# Patient Record
Sex: Female | Born: 1945 | Race: White | Hispanic: No | Marital: Married | Smoking: Never smoker
Health system: Southern US, Community
[De-identification: ages and names within clinical notes are randomized; demographics above are authoritative.]

## PROBLEM LIST (undated history)

## (undated) DIAGNOSIS — E039 Hypothyroidism, unspecified: Secondary | ICD-10-CM

## (undated) DIAGNOSIS — E785 Hyperlipidemia, unspecified: Secondary | ICD-10-CM

## (undated) DIAGNOSIS — G379 Demyelinating disease of central nervous system, unspecified: Secondary | ICD-10-CM

## (undated) DIAGNOSIS — F419 Anxiety disorder, unspecified: Secondary | ICD-10-CM

## (undated) DIAGNOSIS — M810 Age-related osteoporosis without current pathological fracture: Secondary | ICD-10-CM

## (undated) HISTORY — DX: Anxiety disorder, unspecified: F41.9

## (undated) HISTORY — DX: Demyelinating disease of central nervous system, unspecified: G37.9

## (undated) HISTORY — DX: Age-related osteoporosis without current pathological fracture: M81.0

## (undated) HISTORY — DX: Hypothyroidism, unspecified: E03.9

## (undated) HISTORY — DX: Hyperlipidemia, unspecified: E78.5

## (undated) HISTORY — PX: BREAST BIOPSY: SHX20

## (undated) HISTORY — PX: COLONOSCOPY: SHX174

---

## 2019-12-23 ENCOUNTER — Encounter: Payer: Self-pay | Admitting: Neurology

## 2019-12-23 ENCOUNTER — Other Ambulatory Visit: Payer: Self-pay | Admitting: Neurology

## 2019-12-23 NOTE — Progress Notes (Addendum)
[facility]    Provider:  Dr Lucia Gaskins Requesting Provider: Jerene Pitch, MD Primary Care Provider:  Jerene Pitch, MD  CC:  Memory loss  HPI:  Paulena Servais is a 74 y.o. female here as requested by Jerene Pitch, MD for dementia.  I reviewed Dr. 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  She is currently on amantadine 2 times daily, also on memantine but I do not see donepezil.  Last labs taken November 06, 2019 included a BUN of 16 and a creatinine of 1.02, LFTs normal.  Did state that her memory deficits were getting worse, her husband Maryclare Bean having to do all the cooking, he does not know what meds she is taking but "she has 20 bottles on her bedside table", she has 3 different daily weekly pill dispenser he boxes, patient offers no complaint.  They scheduled a CT of the brain without contrast and she was referred to Dr. Leanna Battles for eval who she already sees.  Other labs found included a B12 which is greater than 1500, BMP from June 30th 2021 showed a BUN of 16 and a creatinine of 1.02.  40 years ago she was told that she had Multiple Sclerosis, sister was bed ridden and passed away being bed ridden. She has a history of dementia for many years. A nurse told her she was starting to look like she had MS. They saw a neurologist in the past, 1998, unclear who in [place]. She was diagnosed with MS possibly. She was treated with parkinson's disease. She is on amantadine unclear why. A year ago they saw another neurologist and he said she did not have MS or parkinson's disease. They are worried about Parkinson's disease  and/or MS. Memory loss at least 30 years and slowly progressed and really got worse a year ago, she also can't hear well. She has 10 falls in the last 2 weeks, she was placed on Remeron and she had psychosis and it was stopped and they went to the ED and now improved. The tremors have resolved with stopping the remeron. Brothre with dementia. Started with short term memory loss in her late 78s. Husband and son provide all the informtation.  Patient is here with her family, son and husband.   Reviewed notes, labs and imaging from outside physicians, which showed: see above  Personally reviewed CT images which showed atrophy and chronic microvascular ischemic changes no acute findings.  Review of Systems: Patient complains of symptoms per HPI as well as the following symptoms: memory loss, confusion. Pertinent negatives and positives per HPI. All others negative.   Social History   Socioeconomic History  . Marital status: Married    Spouse name: Not on file  . Number of children: Not on file  . Years of education: Not on file  . Highest education level: Not on file  Occupational History  . Not on file  Tobacco Use  . Smoking status: Never Smoker  . Smokeless tobacco: Never Used  Substance and Sexual Activity  . Alcohol use: Never  . Drug use: Never  . Sexual activity: Not on file  Other Topics Concern  . Not on file  Social History Narrative  . Not on file   Social Determinants of Health   Financial Resource Strain:   . Difficulty of Paying Living Expenses:   Food Insecurity:   . Worried About Programme researcher, broadcasting/film/video in the Last Year:   . Barista in the Last Year:   Transportation Needs:   . Freight forwarder (Medical):   Marland Kitchen Lack of Transportation (Non-Medical):   Physical Activity:   . Days of Exercise per Week:   . Minutes of Exercise per Session:   Stress:   . Feeling of Stress :   Social Connections:   . Frequency of Communication with Friends and Family:     . Frequency of Social Gatherings with Friends and Family:   . Attends Religious Services:   . Active Member of Clubs or Organizations:   . Attends Banker Meetings:   Marland Kitchen Marital Status:   Intimate Partner Violence:   . Fear of Current or Ex-Partner:   . Emotionally Abused:   Marland Kitchen Physically Abused:   . Sexually Abused:     Family History  Problem Relation Age of Onset  . Dementia Brother     Past Medical History:  Diagnosis Date  . Anxiety disorder   . Demyelinating disease of central nervous system (HCC)   . Dyslipidemia   . Hypothyroidism   . Osteoporosis     Patient Active Problem List   Diagnosis Date Noted  . Late onset Alzheimer's disease with behavioral disturbance (HCC) 12/24/2019  . 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 BREAST BIOPSY    . COLONOSCOPY      Current Outpatient Medications  Medication Sig Dispense Refill  . amantadine (SYMMETREL) 100 MG capsule Take 100 mg by mouth 2 (two) times daily.     . busPIRone (BUSPAR) 10 MG tablet Take 10 mg by mouth 3 (three) times daily.     Marland Kitchen levothyroxine (SYNTHROID) 100 MCG tablet Take 100 mcg by mouth daily before breakfast.    . lisinopril-hydrochlorothiazide (ZESTORETIC) 20-25 MG tablet     . memantine (NAMENDA) 10 MG tablet Take 10 mg by mouth 2 (two) times daily.     No current facility-administered medications for this visit.    Allergies as of 12/24/2019  . (Not on File)    Vitals: BP (!) 140/92   Pulse 101   Ht 5' (1.524 m)   Wt (!) 95 lb (43.1 kg)   BMI 18.55 kg/m  Last Weight:  Wt Readings from Last 1 Encounters:  12/24/19 (!) 95 lb (43.1 kg)   Last Height:   Ht Readings from Last 1 Encounters:  12/24/19 5' (1.524 m)     Physical exam: Exam: Gen: NAD, , thin and frail                     CV: RRR, +SEM. No Carotid Bruits. No peripheral edema, warm, nontender Eyes: Conjunctivae clear without exudates or  hemorrhage  Neuro: Detailed Neurologic Exam  Speech:    Speech is normal; fluent and impaired comprehension.  Cognition: MMSE - Mini Mental State Exam 12/24/2019  Orientation to time 0  Orientation to Place 1  Registration 3  Attention/ Calculation 5  Recall 0  Language- name 2 objects 2  Language- repeat 1  Language- follow 3 step command 1  Language- read & follow direction 0  Write a sentence 1  Copy design 0  Total  score 14    Cranial Nerves:    The pupils are equal, round, and reactive to light.attempted fundoscopy could not visualize. Blinks to threat. Extraocular movements are intact. Trigeminal sensation is intact and the muscles of mastication are normal. The face is symmetric. The palate elevates in the midline. Hearing intact. Voice is normal. Shoulder shrug is normal. The tongue has normal motion without fasciculations.   Coordination:    No dysmetria or ataxia  Gait:    Can get up from chair independently, short strides, low clearance, using 2 walking aids for each hand.   Motor Observation: mild head tremor.  No asymmetry, no atrophy, and no involuntary movements noted. Tone:    Normal muscle tone.    Posture:    Posture is normal. normal erect    Strength: antigravity in all limbs, symmrtical      Sensation: intact to LT     Reflex Exam:  DTR's:    Deep tendon reflexes in the upper and lower extremities are brisk bilaterally.   Toes:    The toes are downgoing bilaterally.   Clonus:    Clonus is absent.    Assessment/Plan:   74 y.o. female here as requested by Jerene Pitch, MD for dementia.  I reviewed Dr. 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  Patient's MMSE today is 14 out of 30, she has had dementia for many years, at this advanced state it is really hard to tell what kind of dementia it was but  it sounds like it was Alzheimer's possibly early onset in her late 15s.  They are more concerned about her history of multiple sclerosis or the possibility she has Parkinson's disease.  Based on the CT, reviewed report, showed mild nonspecific T2 white matter (I personally reviewed) it does not look as though she has MS and I believe she has seen neurology in the past who told her she did not have MS or Parkinson's disease.  I agree that she does not have Parkinson's disease, we can repeat MRI of the brain and cervical spine to evaluate for multiple sclerosis burden.  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  - We did discuss dementia, the different forms of dementia and how they present, it sounds like patient may have had Alzheimer's however it is very difficult to tell.  Also was unusual as initially he said that she developed memory loss in her 82s then when questioned he said may be in her 47s so it is unclear when it even started or if she has any psychiatric history.  - We will wait and see when patient has the imaging scheduled, and at that time we will scheduled him for a follow-up appointment that same day because they drove about an hour and a half to see [place].  - We discussed fall precautions, it appears as though her  recent increase in falls was due to side effect to medication that she is off and almost back to her baseline.  - Patient and family live an hour and a half away, when we get these approved can you please  schedule the MRIs for them in the afternoon so that I can schedule them for a follow-up appointment the same day last appointment of the day.  Please let me know what date they are scheduled for and also call family and let them know.    - Can decrease Amantadine to once daily and see if she has any side effects or worsening of symptoms  Orders Placed This Encounter  Procedures  . MR BRAIN W WO CONTRAST  . MR CERVICAL SPINE W WO CONTRAST   No orders of the defined types were placed in this encounter.   Cc: Jerene Pitch, MD,    Naomie Dean, MD  [facility] [address] [place], [place] [address]  Phone [phone] Fax [phone]  I spent 90 minutes of face-to-face and non-face-to-face time with patient on the  1. Late onset Alzheimer's disease with behavioral disturbance (HCC)   2. Gait apraxia   3. Ataxia   4. Fall, initial encounter   5. Gait abnormality   6. Dementia with behavioral disturbance, unspecified dementia type (HCC)   7. History of multiple sclerosis (HCC)    diagnosis.  This included previsit chart review, lab review, study review, order entry, electronic health record documentation, patient education on the different diagnostic and therapeutic options, counseling and coordination of care, risks and benefits of management, compliance, or risk factor reduction

## 2019-12-24 ENCOUNTER — Encounter: Payer: Self-pay | Admitting: Neurology

## 2019-12-24 ENCOUNTER — Other Ambulatory Visit: Payer: Self-pay

## 2019-12-24 ENCOUNTER — Ambulatory Visit (INDEPENDENT_AMBULATORY_CARE_PROVIDER_SITE_OTHER): Payer: Medicare Other | Admitting: Neurology

## 2019-12-24 VITALS — BP 140/92 | HR 101 | Ht 60.0 in | Wt 95.0 lb

## 2019-12-24 DIAGNOSIS — R482 Apraxia: Secondary | ICD-10-CM | POA: Diagnosis not present

## 2019-12-24 DIAGNOSIS — R269 Unspecified abnormalities of gait and mobility: Secondary | ICD-10-CM | POA: Diagnosis not present

## 2019-12-24 DIAGNOSIS — F0391 Unspecified dementia with behavioral disturbance: Secondary | ICD-10-CM

## 2019-12-24 DIAGNOSIS — G301 Alzheimer's disease with late onset: Secondary | ICD-10-CM

## 2019-12-24 DIAGNOSIS — R270 Ataxia, unspecified: Secondary | ICD-10-CM | POA: Diagnosis not present

## 2019-12-24 DIAGNOSIS — W19XXXA Unspecified fall, initial encounter: Secondary | ICD-10-CM

## 2019-12-24 DIAGNOSIS — G35 Multiple sclerosis: Secondary | ICD-10-CM

## 2019-12-24 DIAGNOSIS — F03918 Unspecified dementia, unspecified severity, with other behavioral disturbance: Secondary | ICD-10-CM | POA: Insufficient documentation

## 2019-12-24 DIAGNOSIS — F0281 Dementia in other diseases classified elsewhere with behavioral disturbance: Secondary | ICD-10-CM | POA: Diagnosis not present

## 2019-12-24 NOTE — Patient Instructions (Addendum)
Gait apraxia of dementia, likely Alzheimer's dementia MRI of the brain and cervical spine Can decrease the amantadine to once daily and see how that goes  Dementia Dementia is a condition that affects the way the brain functions. It often affects memory and thinking. Usually, dementia gets worse with time and cannot be reversed (progressive dementia). There are many types of dementia, including:  Alzheimer's disease. This type is the most common.  Vascular dementia. This type may happen as the result of a stroke.  Lewy body dementia. This type may happen to people who have Parkinson's disease.  Frontotemporal dementia. This type is caused by damage to nerve cells (neurons) in certain parts of the brain. Some people may be affected by more than one type of dementia. This is called mixed dementia. What are the causes? Dementia is caused by damage to cells in the brain. The area of the brain and the types of cells damaged determine the type of dementia. Usually, this damage is irreversible or cannot be undone. Some examples of irreversible causes include:  Conditions that affect the blood vessels of the brain, such as diabetes, heart disease, or blood vessel disease.  Genetic mutations. In some cases, changes in the brain may be caused by another condition and can be reversed or slowed. Some examples of reversible causes include:  Injury to the brain.  Certain medicines.  Infection, such as meningitis.  Metabolic problems, such as vitamin B12 deficiency or thyroid disease.  Pressure on the brain, such as from a tumor or blood clot. What are the signs or symptoms? Symptoms of dementia depend on the type of dementia. Common signs of dementia include problems with remembering, thinking, problem solving, decision making, and communicating. These signs develop slowly or get worse with time. This may include:  Problems remembering things.  Having trouble taking a bath or putting clothes  on.  Forgetting appointments.  Forgetting to pay bills.  Difficulty planning and preparing meals.  Having trouble speaking.  Getting lost easily. How is this diagnosed? This condition is diagnosed by a specialist (neurologist). It is diagnosed based on the history of your symptoms, your medical history, a physical exam, and tests. Tests may include:  Tests to evaluate brain function, such as memory tests, cognitive tests, and other tests.  Lab tests, such as blood or urine tests.  Imaging tests, such as a CT scan, a PET scan, or an MRI.  Genetic testing. This may be done if other family members have a diagnosis of certain types of dementia. Your health care provider will talk with you and your family, friends, or caregivers about your history and symptoms. How is this treated?  Treatment for this condition depends on the cause of the dementia. Progressive dementias, such as Alzheimer's disease, cannot be cured, but there may be treatments that help to manage symptoms. Treatment might involve taking medicines that may help to:  Control the dementia.  Slow down the progression of the dementia.  Manage symptoms. In some cases, treating the cause of your dementia can improve symptoms, reverse symptoms, or slow down how quickly your dementia becomes worse. Your health care provider can direct you to support groups, organizations, and other health care providers who can help with decisions about your care. Follow these instructions at home: Medicines  Take over-the-counter and prescription medicines only as told by your health care provider.  Use a pill organizer or pill reminder to help you manage your medicines.  Avoid taking medicines that can affect  thinking, such as pain medicines or sleeping medicines. Lifestyle  Make healthy lifestyle choices. ? Be physically active as told by your health care provider. ? Do not use any products that contain nicotine or tobacco, such as  cigarettes, e-cigarettes, and chewing tobacco. If you need help quitting, ask your health care provider. ? Do not drink alcohol. ? Practice stress-management techniques when you get stressed. ? Spend time with other people.  Make sure to get quality sleep. These tips can help you get a good night's rest: ? Avoid napping during the day. ? Keep your sleeping area dark and cool. ? Avoid exercising during the few hours before you go to bed. ? Avoid caffeine products in the evening. Eating and drinking  Drink enough fluid to keep your urine pale yellow.  Eat a healthy diet. General instructions   Work with your health care provider to determine what you need help with and what your safety needs are.  Talk with your health care provider about whether it is safe for you to drive.  If you were given a bracelet that identifies you as a person with memory loss or tracks your location, make sure to wear it at all times.  Work with your family to make important decisions, such as advance directives, medical power of attorney, or a living will.  Keep all follow-up visits as told by your health care provider. This is important. Where to find more information  Alzheimer's Association: LimitLaws.hu  [employer] on Aging: CashCowGambling.be  World Health Organization: https://castaneda-walker.com/ Contact a health care provider if:  You have any new or worsening symptoms.  You have problems with choking or swallowing. Get help right away if:  You feel depressed or sad, or feel that you want to harm yourself.  Your family members become concerned for your safety. If you ever feel like you may hurt yourself or others, or have thoughts about taking your own life, get help right away. You can go to your nearest emergency department or call:  Your local emergency services (911 in the [place]  A suicide crisis helpline, such as the National Suicide Prevention Lifeline at 567-632-2641. This is  open 24 hours a day. Summary  Dementia is a condition that affects the way the brain functions. Dementia often affects memory and thinking.  Usually, dementia gets worse with time and cannot be reversed (progressive dementia).  Treatment for this condition depends on the cause of the dementia.  Work with your health care provider to determine what you need help with and what your safety needs are.  Your health care provider can direct you to support groups, organizations, and other health care providers who can help with decisions about your care. This information is not intended to replace advice given to you by your health care provider. Make sure you discuss any questions you have with your health care provider. Document Revised: 08/05/2018 Document Reviewed: 08/05/2018 Elsevier Patient Education  2020 [employer].

## 2019-12-26 ENCOUNTER — Telehealth: Payer: Self-pay | Admitting: Neurology

## 2019-12-26 NOTE — Telephone Encounter (Signed)
Medicare/[place]ual of [place] order sent to GI. No auth they will reach out to the patient to schedule.  

## 2019-12-28 ENCOUNTER — Other Ambulatory Visit: Payer: Self-pay | Admitting: Neurology

## 2019-12-28 DIAGNOSIS — R482 Apraxia: Secondary | ICD-10-CM

## 2019-12-28 NOTE — Telephone Encounter (Signed)
[facility] would not let patient husband come in with the patient because per their protocol with Covid. So right now the patient is scheduled at [facility] for Thursday 02/11/20.. has the patient had any labs the BUN and creatinine within the last 6 weeks because if not [facility] will have to draw labs that day an hour before her appointment. Right now her appointment is at 2 pm because that is the latest they can do for a double study with contrast.

## 2019-12-28 NOTE — Telephone Encounter (Signed)
BMP December 02 2019 showed a BUN of 16 and a creatinine of 1.02 from her primary careand that is documented in my notes. Will this work? Do they need a copy of the actual labs? thanks

## 2019-12-30 NOTE — Telephone Encounter (Signed)
The BUN and creatinie has to be in within 6 weeks of the MRI appointment. She is schedule for 9/9/21at [facility] that is 6 weeks after she has had a recent lab works. When you get a chance can you put the lab order in and she will have the labs done the day of her MRI appointment at [facility].   Also, before I call the patient husband to give him appointment information are you going to see them in the office the same day?

## 2019-12-30 NOTE — Telephone Encounter (Signed)
The BUN and creatinie has to be in within 6 weeks of the MRI appointment. She is schedule for 02/11/20

## 2019-12-31 ENCOUNTER — Other Ambulatory Visit: Payer: Self-pay | Admitting: Neurology

## 2019-12-31 DIAGNOSIS — F0390 Unspecified dementia without behavioral disturbance: Secondary | ICD-10-CM

## 2019-12-31 NOTE — Telephone Encounter (Signed)
Per Dr. Lucia Gaskins, scheduled pt in 4 pm slot on 02/11/20 the same day as imaging appt.

## 2020-01-04 NOTE — Telephone Encounter (Signed)
Spoke with pt's husband. They plan to come to office for f/u around 4 pm right after MRI on 02/11/20.

## 2020-01-05 NOTE — Telephone Encounter (Signed)
I spoke with the patient husband and informed him to be at [facility] at 1 pm on 02/11/20 so she can get recent blood work done before she has the MRI. And he is aware to come to our office once the MRI's are done.

## 2020-01-12 ENCOUNTER — Emergency Department (HOSPITAL_COMMUNITY)
Admission: EM | Admit: 2020-01-12 | Discharge: 2020-01-12 | Discharge status: Absent without leave | Payer: No Typology Code available for payment source

## 2020-01-12 ENCOUNTER — Telehealth: Payer: Self-pay | Admitting: Neurology

## 2020-01-12 ENCOUNTER — Other Ambulatory Visit: Payer: Self-pay

## 2020-01-12 NOTE — Telephone Encounter (Signed)
Pt's son, [place] (on [place]) calling Pt condition getting worse; not sleeping, talking to people not there, getting up at 3am trying to cook. Would like to speak with the nurse about Pt coming in early 9/9.

## 2020-01-13 NOTE — Telephone Encounter (Signed)
I returned son's call. LVM with office number for call back.

## 2020-01-14 NOTE — Telephone Encounter (Signed)
I checked the chart because I was going to try to reach the son once more. Patient has been admitted to [facility], appears she has other medical issues going on unfortunately.

## 2020-01-15 NOTE — Telephone Encounter (Signed)
She was admitted,I reviewed notes,  metabolic encephalopathy as well as delirium with dementia. They discontinued many of her prior medications, hope this helps too, will continue to follow with patient

## 2020-02-01 ENCOUNTER — Ambulatory Visit: Payer: Medicare Other | Admitting: Neurology

## 2020-02-11 ENCOUNTER — Other Ambulatory Visit: Payer: Self-pay | Admitting: Neurology

## 2020-02-11 ENCOUNTER — Other Ambulatory Visit: Payer: Self-pay

## 2020-02-11 ENCOUNTER — Ambulatory Visit: Payer: Medicare Other | Admitting: Neurology

## 2020-02-11 ENCOUNTER — Ambulatory Visit (HOSPITAL_COMMUNITY)
Admission: RE | Admit: 2020-02-11 | Discharge: 2020-02-11 | Disposition: A | Discharge status: Home / Self Care | Payer: Medicare Other | Source: Ambulatory Visit | Attending: Neurology | Admitting: Neurology

## 2020-02-11 ENCOUNTER — Telehealth: Payer: Self-pay | Admitting: Neurology

## 2020-02-11 DIAGNOSIS — G35D Multiple sclerosis, unspecified: Secondary | ICD-10-CM

## 2020-02-11 DIAGNOSIS — W19XXXA Unspecified fall, initial encounter: Secondary | ICD-10-CM

## 2020-02-11 DIAGNOSIS — R270 Ataxia, unspecified: Secondary | ICD-10-CM | POA: Diagnosis present

## 2020-02-11 DIAGNOSIS — F0391 Unspecified dementia with behavioral disturbance: Secondary | ICD-10-CM

## 2020-02-11 DIAGNOSIS — R269 Unspecified abnormalities of gait and mobility: Secondary | ICD-10-CM

## 2020-02-11 DIAGNOSIS — G35 Multiple sclerosis: Secondary | ICD-10-CM | POA: Diagnosis not present

## 2020-02-11 DIAGNOSIS — M47812 Spondylosis without myelopathy or radiculopathy, cervical region: Secondary | ICD-10-CM | POA: Insufficient documentation

## 2020-02-11 NOTE — Telephone Encounter (Signed)
Best call back changed to [phone].

## 2020-02-11 NOTE — Telephone Encounter (Signed)
Pt missed todays appointment, accidentally pressed two to cancel would like sooner appt than one scheduled in December. Questioning whether we have the records from [place]? Best call back [phone].

## 2020-02-14 NOTE — Telephone Encounter (Signed)
I am not aware of any records let's check with [facility]. I reviewed the MRIs, and unfortunately there is nothing reversible found. She has a history of MS and I do see old MS lesions although none are new. I do think what we discussed in the office is still accurate, patient has dementia; the  MRI shows lots of atrophy moreso in the areas we associate with Alzheimer's disease and the MS lesions can also contribute to dementia. Unfortunately there is nothing reversible seen. I think I could schedule a phone call with family this week to discuss findings and if they want to come in person when I have an opening then they are welcome to. But let's do a phone call appointment this week so I can tell them what I saw and then we can schedule an in office appointment if needed. I did discuss the MS lesions with the reading neuroradiologists and with Dr. Epimenio Foot our MS specialist and what we are seeing are old MS lesions - in addition, at this late stage and without any new lesions then treatment for MS is unlikely needed and would not improve anything either (may only cause side effects). There were a few other incidental findings I can discuss otp. thanks

## 2020-02-15 NOTE — Telephone Encounter (Signed)
I spoke with pt's husband and scheduled a TELEPHONE call to discuss results this Wednesday 9/15 @ 3:30 pm. He is aware they will not be coming to the office. This will be a phone call and we discuss at that time when to meet in person. He verbalized appreciation for the call.

## 2020-02-15 NOTE — Telephone Encounter (Signed)
Are you comfortable making this call? I just want to set up a phone/video call to review results. You don't have to go into detail on the information I can do that on the phone or video with them but it is there in case they ask thanks

## 2020-02-17 ENCOUNTER — Ambulatory Visit (INDEPENDENT_AMBULATORY_CARE_PROVIDER_SITE_OTHER): Payer: Medicare Other | Admitting: Neurology

## 2020-02-17 ENCOUNTER — Other Ambulatory Visit: Payer: Self-pay

## 2020-02-17 DIAGNOSIS — W19XXXD Unspecified fall, subsequent encounter: Secondary | ICD-10-CM

## 2020-02-17 DIAGNOSIS — R269 Unspecified abnormalities of gait and mobility: Secondary | ICD-10-CM

## 2020-02-17 DIAGNOSIS — G35 Multiple sclerosis: Secondary | ICD-10-CM | POA: Diagnosis not present

## 2020-02-17 NOTE — Progress Notes (Addendum)
[facility]    Provider:  Dr Lucia Gaskins Requesting Provider: Jerene Pitch, MD Primary Care Provider:  Jerene Pitch, MD  CC:  Memory loss   Virtual Visit via Telephone Note  I connected with Bufford Buttner on 02/18/20 at  3:30 PM EDT by telephone and verified that I am speaking with the correct person using two identifiers.  Location: Patient: Home Provider: Office   I discussed the limitations, risks, security and privacy concerns of performing an evaluation and management service by telephone and the availability of in person appointments. I also discussed with the patient that there may be a patient responsible charge related to this service. The patient expressed understanding and agreed to proceed.  Follow Up Instructions:    I discussed the assessment and treatment plan with the patient. The patient was provided an opportunity to ask questions and all were answered. The patient agreed with the plan and demonstrated an understanding of the instructions.   The patient was advised to call back or seek an in-person evaluation if the symptoms worsen or if the condition fails to improve as anticipated.  I provided 30 minutes of non-face-to-face time during this encounter. I also spent 30 minutes of additional time reviewing images of the brain and cervical spine and discussing with reading neuroradiologist and with multiple sclerosis expert.   Anson Fret, MD  Interval history 02/17/2020: I spoke with husband today regarding results of MRI of the brain and cervical spine.  Briefly, this is a patient with a past medical history of demyelinating CNS disease, dementia, abnormality of gait and gait apraxia, she has significant/advanced dementia and she was evaluated by me several months ago to see if there is anything reversible.  We completed extensive blood work and also an MRI of the brain and the cervical spine.  MRI of the cervical spine showed a normal  cervical cord, this was done to rule out any other etiology of her gait abnormality.  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  I discussed dementia, gait apraxia with husband, we also discussed that at this point it is very difficult to tell in her advanced state what kind of dementia she has but it may be mixed considering her MS lesions, she could not complete formal neuropsych testing, this is a progressive disorder likely, also she is tried to both Aricept and Namenda and could not tolerate and at this time there is really no other treatment.  She did fall and I did advise him that she cannot be left alone, she needs 24 x 7 care, she needs to be monitored, other than doing that there is no way to stop her from falling, she always has to use a walking aid but with her dementia she just needs to be monitored at all times.  HPI:  Misty Carrillo is a 74 y.o. female here as requested by  Jerene Pitch, MD for dementia.  I reviewed Dr. 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  She is currently on amantadine 2 times daily, also on memantine but I do not see donepezil.  Last labs taken November 06, 2019 included a BUN of 16 and a creatinine of 1.02, LFTs normal.  Did state that her memory deficits were getting worse, her husband Maryclare Bean having to do all the cooking, he does not know what meds she is taking but "she has 20 bottles on her bedside table", she has 3 different daily weekly pill dispenser he boxes, patient offers no complaint.  They scheduled a CT of the brain without contrast and she was referred to Dr. Leanna Battles for eval who she already sees.  Other labs found included a B12 which is greater than 1500, BMP from June 30th 2021 showed a BUN of 16 and a creatinine of 1.02.  40 years ago she was told that she had Multiple Sclerosis, sister was bed ridden and passed away being bed ridden. She has a history of dementia for many years. A nurse told her she was starting to look like she had MS. They saw a neurologist in the past, 1998, unclear who in [place]. She was diagnosed with MS possibly. She was treated with parkinson's disease. She is on amantadine unclear why. A year ago they saw another neurologist and he said she did not have MS or parkinson's disease. They are worried about Parkinson's disease and/or MS. Memory loss at least 30 years and slowly progressed and really got worse a year ago, she also can't hear well. She has 10 falls in the last 2 weeks, she was placed on Remeron and she had psychosis and it was stopped and they went to the ED and now improved. The tremors have resolved with stopping the remeron. Brothre with dementia. Started with short term memory loss in her late 23s. Husband and son provide all the informtation.  Patient is here with her family, son and husband.   Reviewed notes, labs and imaging from outside physicians, which showed: see above  Personally reviewed CT images which showed atrophy and chronic microvascular ischemic  changes no acute findings.  Review of Systems: Patient complains of symptoms per HPI as well as the following symptoms: memory loss, confusion, falls all others negative. Pertinent negatives and positives per HPI. All others negative.   Social History   Socioeconomic History  . Marital status: Married    Spouse name: Not on file  . Number of children: Not on file  . Years of education: Not on file  . Highest education level: Not on file  Occupational History  . Not on file  Tobacco Use  . Smoking status: Never Smoker  . Smokeless tobacco: Never Used  Substance and Sexual Activity  . Alcohol use: Never  . Drug use: Never  . Sexual activity: Not on file  Other Topics Concern  . Not on file  Social History Narrative  . Not on file   Social Determinants of Health   Financial Resource Strain:   . Difficulty of Paying Living Expenses: Not on file  Food Insecurity:   . Worried  About Running Out of Food in the Last Year: Not on file  . Ran Out of Food in the Last Year: Not on file  Transportation Needs:   . Lack of Transportation (Medical): Not on file  . Lack of Transportation (Non-Medical): Not on file  Physical Activity:   . Days of Exercise per Week: Not on file  . Minutes of Exercise per Session: Not on file  Stress:   . Feeling of Stress : Not on file  Social Connections:   . Frequency of Communication with Friends and Family: Not on file  . Frequency of Social Gatherings with Friends and Family: Not on file  . Attends Religious Services: Not on file  . Active Member of Clubs or Organizations: Not on file  . Attends Banker Meetings: Not on file  . Marital Status: Not on file  Intimate Partner Violence:   . Fear of Current or Ex-Partner: Not on file  . Emotionally Abused: Not on file  . Physically Abused: Not on file  . Sexually Abused: Not on file    Family History  Problem Relation Age of Onset  . Dementia Brother     Past Medical History:    Diagnosis Date  . Anxiety disorder   . Demyelinating disease of central nervous system (HCC)   . Dyslipidemia   . Hypothyroidism   . Osteoporosis     Patient Active Problem List   Diagnosis Date Noted  . Multiple sclerosis (HCC) 02/18/2020  . Fall 02/18/2020  . Gait abnormality 02/18/2020  . Dementia with behavioral disturbance (HCC) 12/24/2019  . 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 BREAST BIOPSY    . COLONOSCOPY      Current Outpatient Medications  Medication Sig Dispense Refill  . levothyroxine (SYNTHROID) 100 MCG tablet Take 100 mcg by mouth daily before breakfast.    . lisinopril-hydrochlorothiazide (ZESTORETIC) 20-25 MG tablet      No current facility-administered medications for this visit.    Allergies as of 02/17/2020  . (Not on File)    Vitals: There were no vitals taken for this visit. Last Weight:  Wt Readings from Last 1 Encounters:  12/24/19 (!) 95 lb (43.1 kg)   Last Height:   Ht Readings from Last 1 Encounters:  12/24/19 5' (1.524 m)     Physical exam: PRIOR Exam: Gen: NAD, , thin and frail                     CV: RRR, +SEM. No Carotid Bruits. No peripheral edema, warm, nontender Eyes: Conjunctivae clear without exudates or hemorrhage  Neuro: PRIOR (Today OTP) Detailed Neurologic Exam  Speech:    Speech is normal; fluent and impaired comprehension.  Cognition: MMSE - Mini Mental State Exam 12/24/2019  Orientation to time 0  Orientation to Place 1  Registration 3  Attention/ Calculation 5  Recall 0  Language- name 2 objects 2  Language- repeat 1  Language- follow 3 step command 1  Language- read & follow direction 0  Write a sentence 1  Copy design 0  Total score 14    Cranial Nerves:    The pupils are equal, round, and reactive to light.attempted fundoscopy could not visualize. Blinks to threat. Extraocular movements are intact. Trigeminal sensation is intact and the muscles of  mastication are normal. The face is symmetric. The palate elevates in the midline. Hearing intact. Voice is normal. Shoulder shrug is normal.  The tongue has normal motion without fasciculations.   Coordination:    No dysmetria or ataxia  Gait:    Can get up from chair independently, short strides, low clearance, using 2 walking aids for each hand.   Motor Observation: mild head tremor.  No asymmetry, no atrophy, and no involuntary movements noted. Tone:    Normal muscle tone.    Posture:    Posture is normal. normal erect    Strength: antigravity in all limbs, symmrtical      Sensation: intact to LT     Reflex Exam:  DTR's:    Deep tendon reflexes in the upper and lower extremities are brisk bilaterally.   Toes:    The toes are downgoing bilaterally.   Clonus:    Clonus is absent.    Assessment/Plan:   74 y.o. female here as requested by Jerene Pitch, MD for dementia.  I reviewed Dr. 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  Patient's MMSE today is 14 out of 30, she has had dementia for many years, at this advanced state it is really hard to tell what kind of dementia it was but it sounds like it was Alzheimer's possibly early onset in her late 68s.  They are more concerned about her history of multiple sclerosis or the possibility she has Parkinson's disease.  Based on the CT, reviewed report, showed mild nonspecific T2 white matter (I personally reviewed) it does not look as though she has MS and I believe she has seen neurology in the past who told her she did not have MS or Parkinson's disease.  I agree that she does not have Parkinson's disease, we can repeat MRI of the brain and cervical spine to evaluate for multiple sclerosis burden.  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  - I discussed dementia, gait apraxia with husband, we also discussed that at this point it is very difficult to tell in her advanced state what kind of dementia she has but it may be mixed considering her MS lesions and possibly Alzheimers or other, she could not complete formal neuropsych testing in her advanced state, this is a progressive disorder likely, also she is tried to both Aricept and Namenda and could not tolerate and at this time there is really no other treatment.   - She did fall and I did advise him that she cannot be left alone, she needs 24 x 7 care, she needs to be monitored,  other than doing that there is no way to stop her from falling, she always has to use a walking aid but with her dementia she just needs to be monitored at all times.  - We did discuss dementia, the different forms of dementia and how they present, it sounds like patient may have had Alzheimer's however it is very difficult to tell.  Also was unusual as initially he said that she developed memory loss in her 34s then when questioned he said may be in her 24s so it is unclear when it even started or if she  has any psychiatric history.  -  We discussed fall precautions, it appears as though her recent increase in falls was due to side effect to medication that she is off and almost back to her baseline. She is no longer on Namenda or Amantadine  - Follow up as needed and return to primary care. Nothing further from neurology however family is always welcome to return  Cc: Jerene Pitch, MD,    Naomie Dean, MD  [facility] [address] [place], [place] [address]  Phone [phone] Fax [phone]

## 2020-02-18 DIAGNOSIS — W19XXXA Unspecified fall, initial encounter: Secondary | ICD-10-CM | POA: Insufficient documentation

## 2020-02-18 DIAGNOSIS — R269 Unspecified abnormalities of gait and mobility: Secondary | ICD-10-CM | POA: Insufficient documentation

## 2020-02-18 DIAGNOSIS — G35 Multiple sclerosis: Secondary | ICD-10-CM | POA: Insufficient documentation

## 2020-03-09 ENCOUNTER — Telehealth: Payer: Self-pay | Admitting: Neurology

## 2020-03-09 NOTE — Progress Notes (Signed)
[facility]    Provider:  Dr Lucia Gaskins Requesting Provider: Jerene Pitch, MD Primary Care Provider:  Jerene Pitch, MD  CC:  Memory loss   Virtual Visit via Telephone Note  I connected with Misty Carrillo and her family on 03/14/20 at  3:00 PM EDT by telephone and verified that I am speaking with the correct person using two identifiers.  Location: Patient: Home Provider: Office   I discussed the limitations, risks, security and privacy concerns of performing an evaluation and management service by telephone and the availability of in person appointments. I also discussed with the patient that there may be a patient responsible charge related to this service. The patient expressed understanding and agreed to proceed.  Follow Up Instructions:    I discussed the assessment and treatment plan with the patient. The patient was provided an opportunity to ask questions and all were answered. The patient agreed with the plan and demonstrated an understanding of the instructions.   The patient was advised to call back or seek an in-person evaluation if the symptoms worsen or if the condition fails to improve as anticipated.  I provided more than 85 minutes of non-face-to-face time during this encounter.  Anson Fret, MD  Interval history March 10, 2020: I spoke with family again today including daughter and son and husband of patient Misty Carrillo.  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  The history is quite vague, apparently having memory changes since her 80s or 42s which is highly unusual, MRI of the brain did show old demyelinating plaques but nothing new, and although there was atrophy was not out of proportion for age.  We discussed this at length, and I brought up  the possibility that much of her symptoms could be due to depression, anxiety, the fact that she refuses to wear hearing aids and also possibly attention seeking.  I really not quite sure but MRI of the brain did not look like a woman with dementia for 30 years.  Today patient was more interactive and appeared to have less deficits than when I first saw her.  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  I discussed the different types of dementia and also pseudodementia which may be more plausible than originally thought.  We had a long discussion, patient was present, patient did say she was feeling better, she is recently come off some medications, I highly suggested therapy and she declined it.  At this time we can start Zoloft she does have a history of mood disorder and may be this is more plausible than we initially thought it was.    Interval history 02/17/2020: I spoke with husband today regarding results of MRI of the brain and cervical spine.  Briefly, this is a patient with a past medical history of demyelinating CNS disease, dementia, abnormality of gait and gait apraxia, she has dementia and she was evaluated by me several months ago to see if there is anything reversible.  We completed extensive blood work and also an MRI of the brain and the cervical spine.  MRI of the cervical spine showed a normal cervical cord, this was done to rule out any other etiology of her gait abnormality.  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  I discussed dementia, gait apraxia with husband, we also discussed that at this point it is very difficult to tell in her advanced state what kind of dementia she has but it may be mixed considering her MS lesions, she could not complete formal neuropsych testing, this is a progressive disorder likely, also she has tried both Aricept and Namenda and could not tolerate and at this time there is really no other treatment.  She did fall and I did advise him that she cannot be left alone, she needs 24 x 7 care, she needs to be monitored, other than doing that there is no way to stop her from falling, she always has to use a walking aid but with her dementia she just needs to be monitored at all times.  HPI:  Misty Carrillo is a 74 y.o. female here as requested by Jerene Pitch, MD for dementia.  I reviewed Dr. 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  She is currently on amantadine 2 times daily, also on memantine but I do not see donepezil.  Last labs taken November 06, 2019 included a BUN of 16 and a creatinine of 1.02, LFTs normal.  Did state that her memory deficits were getting worse, her husband Maryclare Bean having to do all the cooking, he does not know what meds she is taking but "she has 20 bottles on her bedside table", she has 3 different daily weekly pill dispenser he boxes, patient offers no complaint.  They scheduled a CT of the  brain without contrast and she was referred to Dr. Leanna Battles for eval who she already sees.  Other labs found included a B12 which is greater than 1500, BMP from June 30th 2021 showed a BUN of 16 and a creatinine of 1.02.  40 years ago she was told that she had Multiple Sclerosis, sister was bed ridden and passed away being bed ridden. She has a history of dementia for many years. A nurse told her she was starting to look like she had MS. They saw a neurologist in the past, 1998, unclear who in [place]. She was diagnosed with MS possibly. She was treated with parkinson's disease. She is on amantadine unclear why. A year ago they saw another neurologist and he said she did not have MS or parkinson's disease. They are worried about Parkinson's disease and/or MS. Memory loss at least 30 years and slowly progressed and really got worse a year ago, she also can't hear well. She has 10 falls in the last 2 weeks,  she was placed on Remeron and she had psychosis and it was stopped and they went to the ED and now improved. The tremors have resolved with stopping the remeron. Brothre with dementia. Started with short term memory loss in her late 24s. Husband and son provide all the informtation.  Patient is here with her family, son and husband.   Reviewed notes, labs and imaging from outside physicians, which showed: see above  Personally reviewed CT images which showed atrophy and chronic microvascular ischemic changes no acute findings.  Review of Systems: Patient complains of symptoms per HPI as well as the following symptoms: memory loss, confusion, falls all others negative. Pertinent negatives and positives per HPI. All others negative.   Social History   Socioeconomic History  . Marital status: Married    Spouse name: Not on file  . Number of children: Not on file  . Years of education: Not on file  . Highest education level: Not on file  Occupational History  . Not on file  Tobacco Use  . Smoking  status: Never Smoker  . Smokeless tobacco: Never Used  Substance and Sexual Activity  . Alcohol use: Never  . Drug use: Never  . Sexual activity: Not on file  Other Topics Concern  . Not on file  Social History Narrative  . Not on file   Social Determinants of Health   Financial Resource Strain:   . Difficulty of Paying Living Expenses: Not on file  Food Insecurity:   . Worried About Programme researcher, broadcasting/film/video in the Last Year: Not on file  . Ran Out of Food in the Last Year: Not on file  Transportation Needs:   . Lack of Transportation (Medical): Not on file  . Lack of Transportation (Non-Medical): Not on file  Physical Activity:   . Days of Exercise per Week: Not on file  . Minutes of Exercise per Session: Not on file  Stress:   . Feeling of Stress : Not on file  Social Connections:   . Frequency of Communication with Friends and Family: Not on file  . Frequency of Social Gatherings with Friends and Family: Not on file  . Attends Religious Services: Not on file  . Active Member of Clubs or Organizations: Not on file  . Attends Banker Meetings: Not on file  . Marital Status: Not on file  Intimate Partner Violence:   . Fear of Current or Ex-Partner: Not on file  . Emotionally Abused: Not on file  . Physically Abused: Not on file  . Sexually Abused: Not on file    Family History  Problem Relation Age of Onset  . Dementia Brother     Past Medical History:  Diagnosis Date  . Anxiety disorder   . Demyelinating disease of central nervous system (HCC)   . Dyslipidemia   . Hypothyroidism   . Osteoporosis     Patient Active Problem List   Diagnosis Date Noted  . 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 Multiple sclerosis (HCC) 02/18/2020  . Fall 02/18/2020  . Gait abnormality 02/18/2020  . 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 BREAST BIOPSY    . COLONOSCOPY      Current Outpatient Medications  Medication Sig Dispense  Refill  . levothyroxine (SYNTHROID) 100 MCG tablet Take 100 mcg by mouth daily before breakfast.    . lisinopril-hydrochlorothiazide (ZESTORETIC) 20-25 MG tablet     . sertraline (ZOLOFT) 50 MG tablet Take  1 tablet (50 mg total) by mouth daily. 30 tablet 2   No current facility-administered medications for this visit.    Allergies as of 03/10/2020  . (Not on File)    Vitals: There were no vitals taken for this visit. Last Weight:  Wt Readings from Last 1 Encounters:  12/24/19 (!) 95 lb (43.1 kg)   Last Height:   Ht Readings from Last 1 Encounters:  12/24/19 5' (1.524 m)     Physical exam: PRIOR Exam: Gen: NAD, , thin and frail                     CV: RRR, +SEM. No Carotid Bruits. No peripheral edema, warm, nontender Eyes: Conjunctivae clear without exudates or hemorrhage  Neuro: PRIOR (Today OTP) Detailed Neurologic Exam  Speech:    Speech is normal; fluent and impaired comprehension.  Cognition: MMSE - Mini Mental State Exam 12/24/2019  Orientation to time 0  Orientation to Place 1  Registration 3  Attention/ Calculation 5  Recall 0  Language- name 2 objects 2  Language- repeat 1  Language- follow 3 step command 1  Language- read & follow direction 0  Write a sentence 1  Copy design 0  Total score 14    Cranial Nerves:    The pupils are equal, round, and reactive to light.attempted fundoscopy could not visualize. Blinks to threat. Extraocular movements are intact. Trigeminal sensation is intact and the muscles of mastication are normal. The face is symmetric. The palate elevates in the midline. Hearing intact. Voice is normal. Shoulder shrug is normal. The tongue has normal motion without fasciculations.   Coordination:    No dysmetria or ataxia  Gait:    Can get up from chair independently, short strides, low clearance, using 2 walking aids for each hand.   Motor Observation: mild head tremor.  No asymmetry, no atrophy, and no involuntary movements  noted. Tone:    Normal muscle tone.    Posture:    Posture is normal. normal erect    Strength: antigravity in all limbs, symmrtical      Sensation: intact to LT     Reflex Exam:  DTR's:    Deep tendon reflexes in the upper and lower extremities are brisk bilaterally.   Toes:    The toes are downgoing bilaterally.   Clonus:    Clonus is absent.    Assessment/Plan:   74 y.o. 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  I reviewed Dr. 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  Patient's MMSE at last appointment was 14 out of 30, she has had dementia for many years per family however after review of the MRI which showed demyelinating plaques and a few incidental findings, the atrophy and other findings of the brain really not consistent with a patient who is supposed to have dementia for many years.  Her gait is improved since she has been taken off many of her medications.  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  Patient's MRI did show old demyelinating plaques but the overall atrophy was not more than I would suspect for stated age and I would expect significant changes if she has had dementia for a decade or more as they initially stated  - I had a long discussion with family about this there may also be some attention seeking going on.  At this time we will start Zoloft and see what happens.  We can also get formal neuropsych testing in the future, we will  check in and see how she is doing after we start Zoloft and titrate to 100 mg.  - MRI of the cervical spine showed a normal cervical cord, this was done to rule out any other etiology of her gait abnormality.   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   - We did discuss dementia, the different forms of dementia and how they present, it sounds like patient may have had Alzheimer's however it is very difficult to tell.  Also was unusual as initially he said that she developed memory loss in her 61s then when questioned he said may be in her 68s so it is unclear when it even started or if she has any psychiatric history.  I am starting to suspect more psychiatric causes or more multifactorial as opposed to an advanced dementia at this time.  -  We discussed fall precautions, it appears as though her recent increase in falls was due to side effect to medication that she is off and almost back to her baseline. She is no longer on Namenda or Amantadine  Cc: Jerene Pitch, MD,    Naomie Dean, MD  [facility] [address] [place], [place] [address]  Phone [phone] Fax [phone]

## 2020-03-09 NOTE — Telephone Encounter (Signed)
I spoke with patient's husband and his daughter and instead of an in office appointment I would like to have a MyChart visit.  They live an hour and a half away and I think that we can discuss over video just as well as we can in the office.  Which you please call the daughter at [phone] tomorrow morning to discuss how to set up my chart and perform a video call.  We will leave him on the schedule at 3:00 but I will actually call them either at [phone] when I am done clinic at the end of the day.  I spoke with daughter about dementia, about depression and anxiety, Aricept, Namenda, we discussed maybe starting some melatonin at night, possibly considering Zoloft in addition to the Aricept.  We will send her a dementia packet in the mail as well.

## 2020-03-10 ENCOUNTER — Telehealth (INDEPENDENT_AMBULATORY_CARE_PROVIDER_SITE_OTHER): Payer: Medicare Other | Admitting: Neurology

## 2020-03-10 ENCOUNTER — Ambulatory Visit: Payer: Medicare Other | Admitting: Neurology

## 2020-03-10 DIAGNOSIS — R4189 Other symptoms and signs involving cognitive functions and awareness: Secondary | ICD-10-CM

## 2020-03-10 MED ORDER — SERTRALINE HCL 50 MG PO TABS: 50.0000 mg | ORAL_TABLET | Freq: Every day | ORAL | 2 refills | Status: DC

## 2020-03-14 DIAGNOSIS — R4189 Other symptoms and signs involving cognitive functions and awareness: Secondary | ICD-10-CM | POA: Insufficient documentation

## 2020-05-17 ENCOUNTER — Telehealth: Payer: Self-pay | Admitting: Neurology

## 2020-05-17 NOTE — Telephone Encounter (Signed)
Pt's husband called wanting to speak to the RN regarding the pt still not sleeping well, still not walking correctly and about her swollen ankles. Please advise.

## 2020-05-18 ENCOUNTER — Ambulatory Visit: Payer: Medicare Other | Admitting: Neurology

## 2020-05-18 NOTE — Telephone Encounter (Signed)
Called Stan back and LVM asking for call back. When he calls back, I will discuss with him that I have spoken with Dr Lucia Gaskins. She has done all she can, nothing further from neurology and please return to PCP.

## 2020-05-19 MED ORDER — SERTRALINE HCL 100 MG PO TABS: 100.0000 mg | ORAL_TABLET | Freq: Every day | ORAL | 4 refills | Status: AC

## 2020-05-19 NOTE — Telephone Encounter (Signed)
Discontinued Sertraline 50 mg tablet. Rx for Sertraline 100 mg PO Daily #90 with 4 refills sent to [facility] in [place], [place] along with note to cancel the 50 mg tablet. Pt's husband, Weyman Croon, was made aware of the plan. He is amenable and verbalized appreciation. He stated he would start giving the patient two of her 50 mg tablets for now until the new Rx is filled.

## 2020-05-19 NOTE — Telephone Encounter (Signed)
Pt's husband has called Bethany,RN back.  Please call

## 2020-05-19 NOTE — Addendum Note (Signed)
Addended by: Bertram Savin on: 05/19/2020 12:23 PM   Modules accepted: Orders

## 2020-05-19 NOTE — Telephone Encounter (Signed)
Yes, please increase it to 100mg , you can give her a 3 month supply with 4 refills.

## 2020-05-19 NOTE — Telephone Encounter (Signed)
Spoke with Weyman Croon. He understands they will need to return to PCP going forward. He asked if the Sertraline 50 mg should be increased as previously discussed during last visit. He stated the patient seems to be tolerating it fine. She takes it at night and before she started it she was having trouble sleeping. I advised she could try taking it during the day to see if that makes a difference. I advised we would call back with response regarding dosage. He verbalized appreciation.

## 2021-05-28 ENCOUNTER — Other Ambulatory Visit: Payer: Self-pay | Admitting: Neurology

## 2022-04-14 IMAGING — MR MR HEAD W/O CM
4 of 8 series · 18 of 48 positions shown · non-contrast
Comparison: None.

CLINICAL DATA: Ataxia, MS

EXAM:
MRI HEAD WITHOUT CONTRAST
MRI CERVICAL SPINE WITHOUT CONTRAST
TECHNIQUE: Multiplanar, multiecho pulse sequences of the brain and surrounding
structures, and cervical spine, to include the craniocervical
junction and cervicothoracic junction, were obtained without
intravenous contrast.

[Series 2: DWI · axial · 3.0mm · 0.94mm/px · z∈[-43,+88]mm · 9 of 100 slices shown (1 of 2)]
[im 1/100]
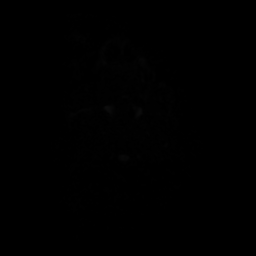
[im 10/100]
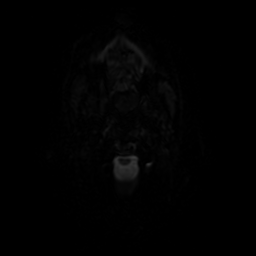
[im 20/100]
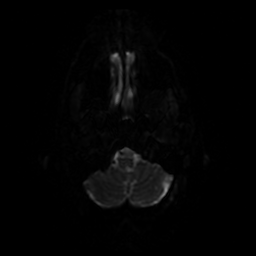
[im 30/100]
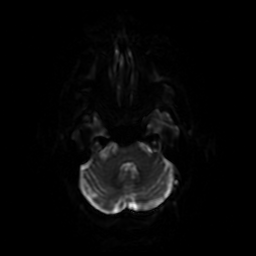
[im 40/100]
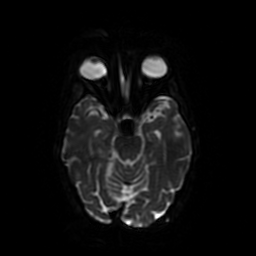
[im 50/100]
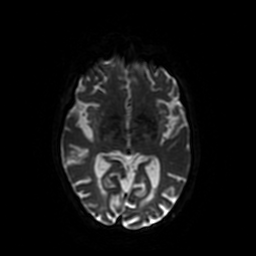
[im 60/100]
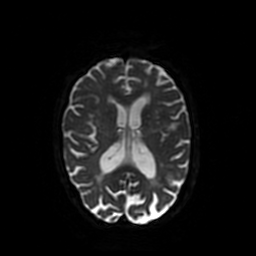
[im 70/100]
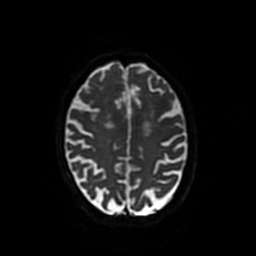
[im 90/100]
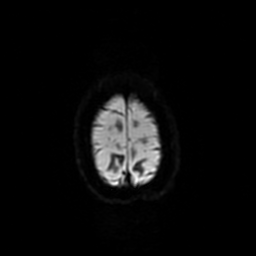

[Series 3: DWI · coronal · 4.0mm · 0.94mm/px · 3 of 72 slices shown (2 of 2)]
[im 11/72]
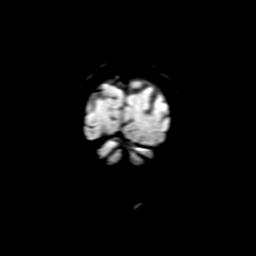
[im 41/72]
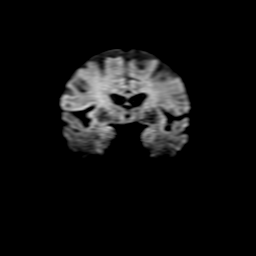
[im 61/72]
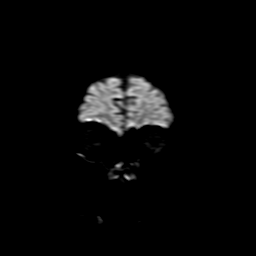

[Series 4: FLAIR · sagittal · 5.0mm · 0.23mm/px · 3 of 23 slices shown (1 of 2)]
[im 1/23]
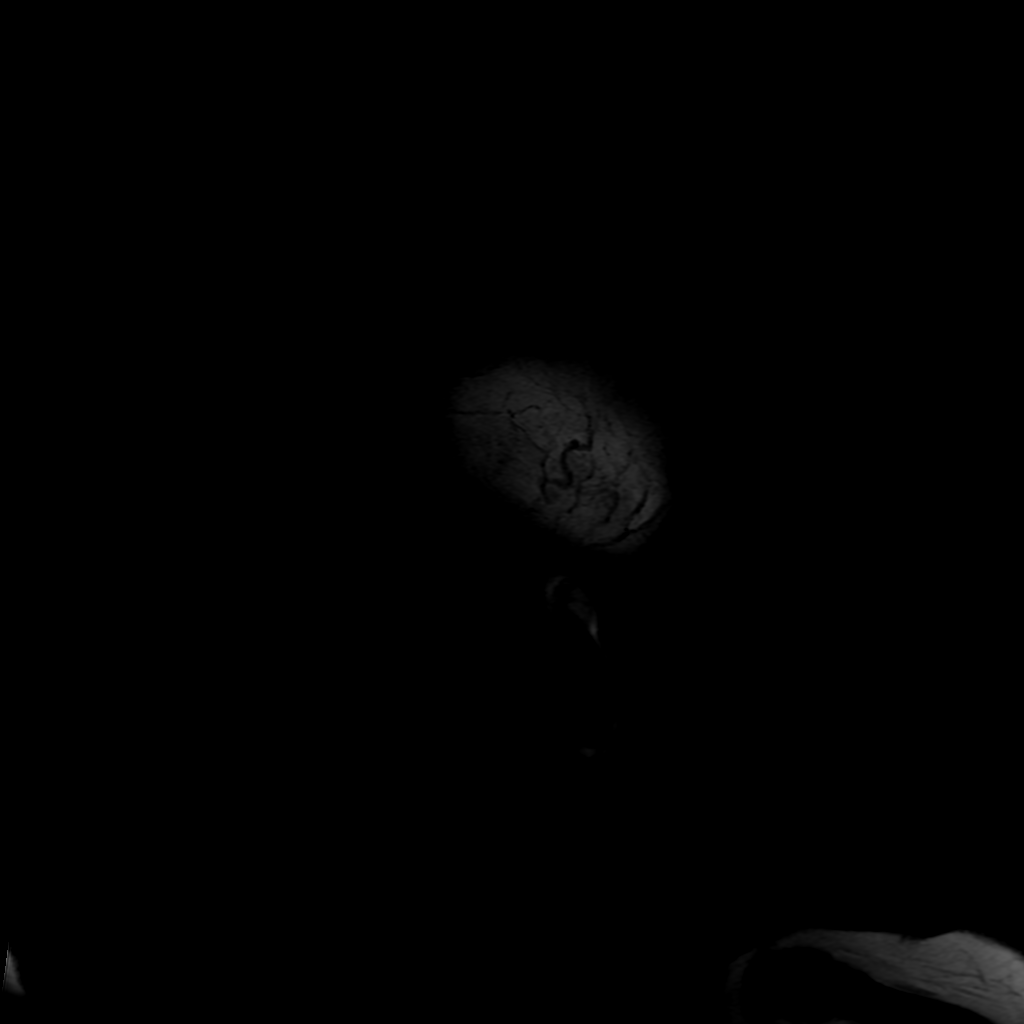
[im 12/23]
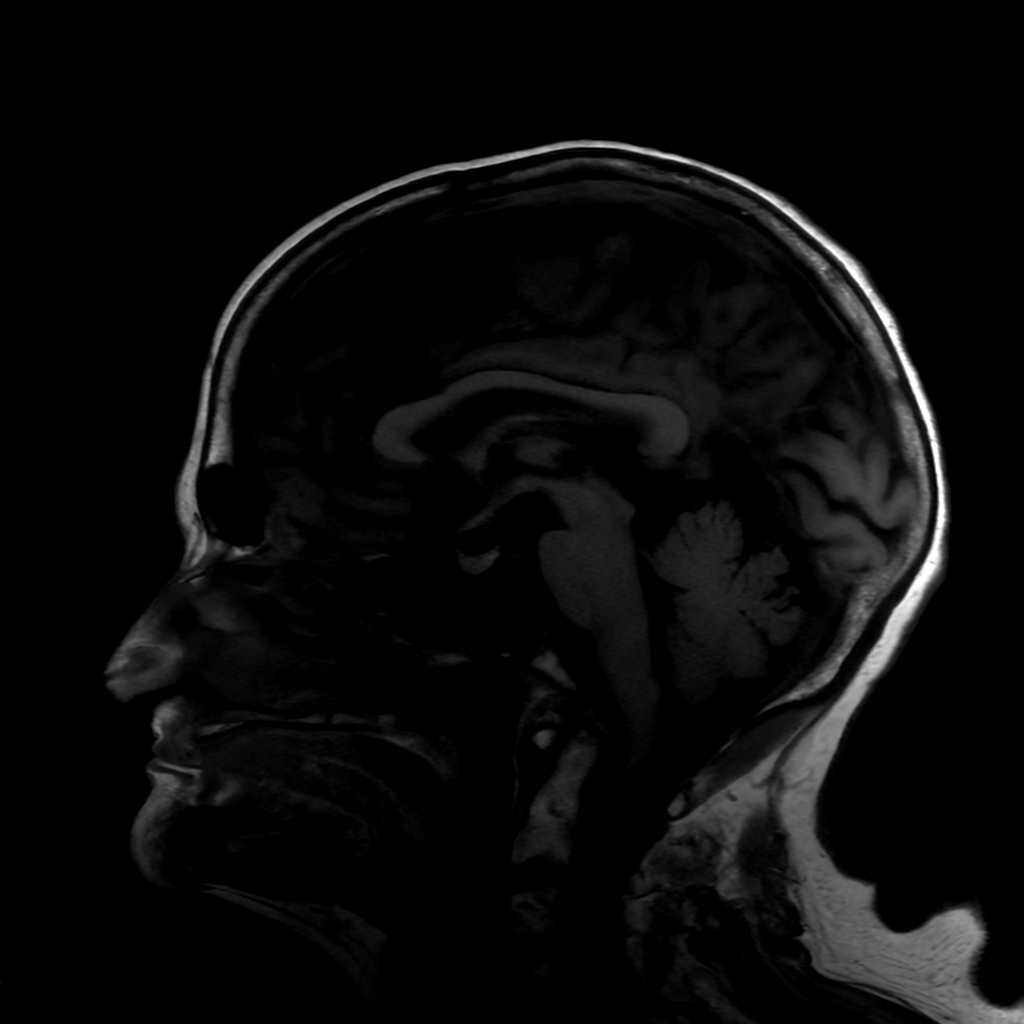
[im 23/23]
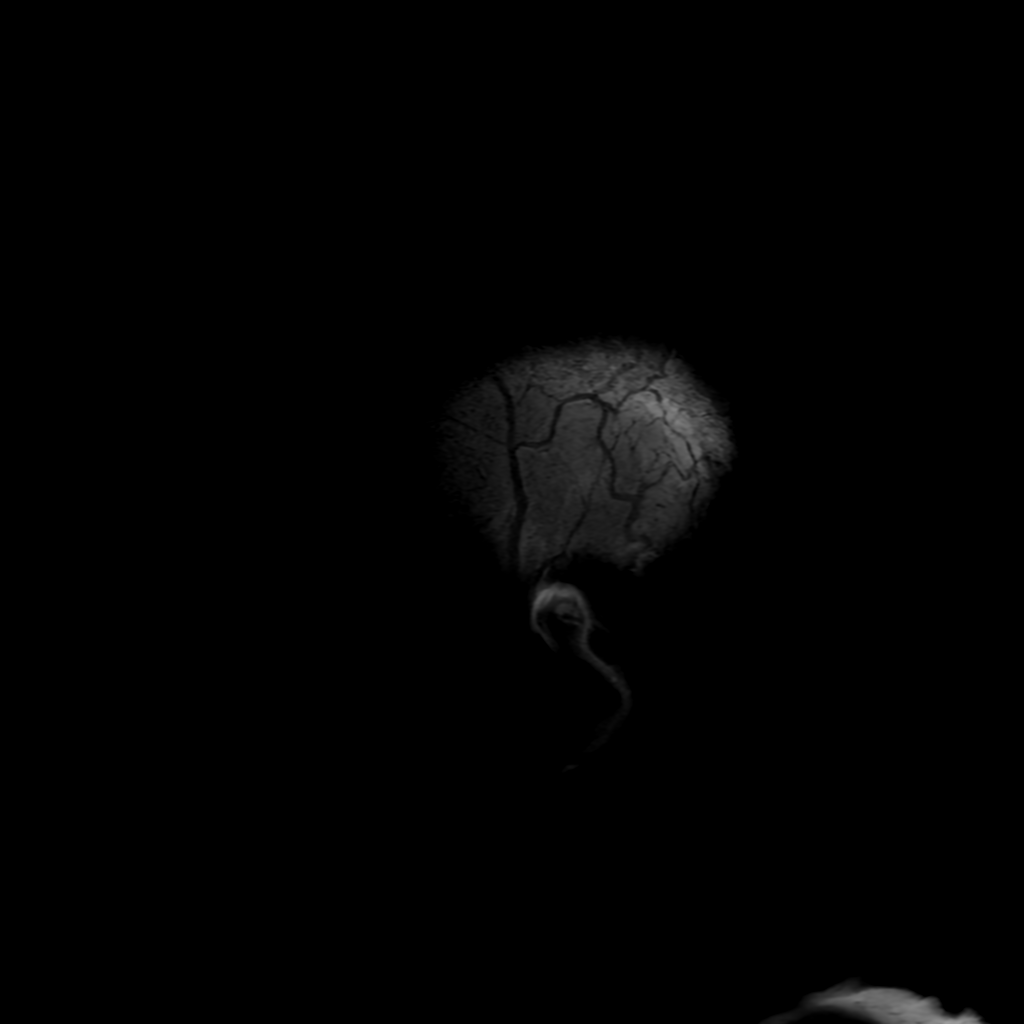

[Series 6: FLAIR · axial · 3.0mm · 0.41mm/px · z∈[-19,+111]mm · 3 of 23 slices shown (2 of 2)]
[im 1/23]
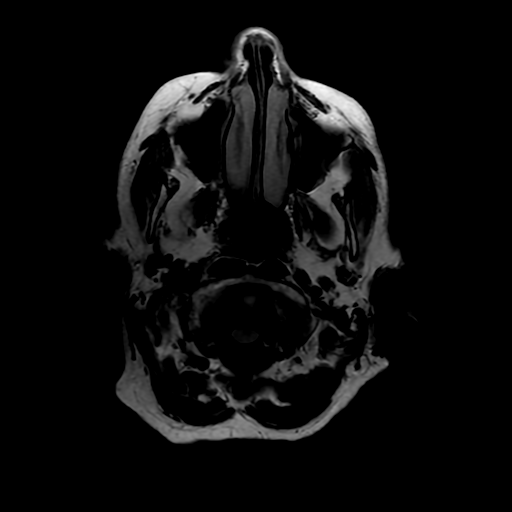
[im 12/23]
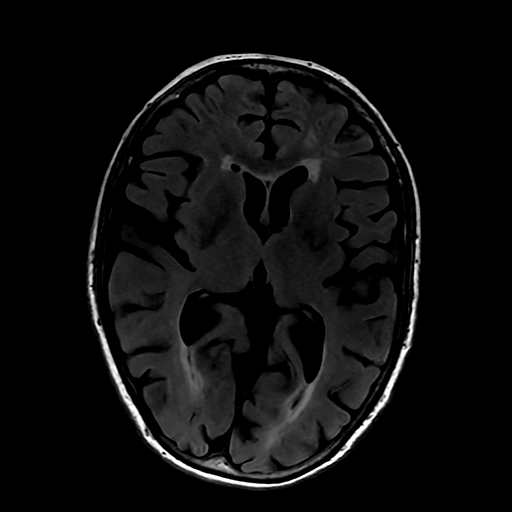
[im 23/23]
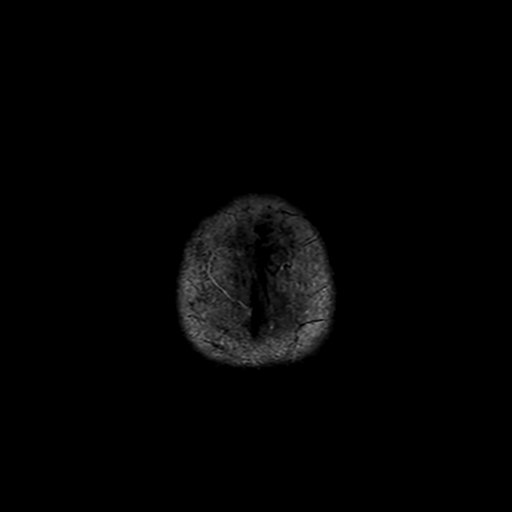

[18 of 48 positions shown; findings below may reference images not displayed]

FINDINGS: Image quality is degraded by motion artifact.

MRI HEAD FINDINGS

Brain: Remote left frontoparietal and left cerebellar
microhemorrhages. Small remote left cerebellar insults. Mild
cerebral atrophy with ex vacuo dilatation.

Scattered T2/FLAIR hyperintense foci involving the periventricular
and subcortical white matter. No infratentorial lesions. A FLAIR
hyperintensity within the right centrum semiovale demonstrates DWI
hyperintensity (2: 37) without focal ADC correlate.

No midline shift, mass lesion or extra-axial fluid collection. No
ventriculomegaly.

Vascular: Normal flow voids.

Skull and upper cervical spine: Normal marrow signal.

Sinuses/Orbits: Normal orbits. Mild ethmoid and maxillary sinus
disease. No mastoid effusion.

Other: None.

MRI CERVICAL SPINE FINDINGS

Alignment: Exaggerated lordosis.

Vertebrae: Normal bone marrow signal intensity. Chronic T3
compression deformity with approximately 20% height loss. No focal
lesion or bone marrow edema.

Cord: Normal signal and morphology.  No focal lesions.

Posterior Fossa, vertebral arteries: Please see MRI head.

Disc levels: Multilevel desiccation with mild disc space loss.

C2-3: Central disc bulge.  Patent spinal canal and neural foramen.

C3-4: Mild disc bulge and left predominant uncovertebral/facet
hypertrophy. Patent spinal canal and neural foramen.

C4-5: Disc osteophyte complex with superimposed right foraminal
protrusion, uncovertebral and bilateral facet hypertrophy. Mild
spinal canal, moderate right and mild left neural foraminal
narrowing.

C5-6: Disc osteophyte complex with uncovertebral and bilateral facet
hypertrophy. Mild spinal canal and bilateral neural foraminal
narrowing.

C6-7: Small disc osteophyte complex. Bilateral uncovertebral and
facet degenerative spurring. Patent spinal canal. Mild bilateral
neural foraminal narrowing.

C7-T1: Small central bulge. No significant spinal canal or neural
foraminal narrowing.

Paraspinal tissues: Negative.
IMPRESSION: MRI head:

Supratentorial white matter signal abnormalities are nonspecific. A
right centrum semiovale lesion demonstrates restricted diffusion,
suggesting that these foci reflect demyelinating lesions.
Differential for the focal lesion includes subacute insult.

Mild cerebral atrophy. Remote left frontoparietal and left
cerebellar microhemorrhages. Small remote left cerebellar infarcts.

MRI cervical spine:

No focal cord lesions.  Motion limits evaluation.

Multilevel spondylosis.  Mild C4-6 spinal canal narrowing.

Moderate right C4-5 neural foraminal narrowing. Mild left C4-5 and
bilateral C5-7 neural foraminal narrowing.

These results were called by telephone at the time of interpretation
on 02/11/2020 at [DATE] to provider JASMILETT TAFFANELLI , who verbally
acknowledged these results.
